# Patient Record
Sex: Female | Born: 1960 | Race: White | Hispanic: No | Marital: Single | Smoking: Former smoker
Health system: Southern US, Community
[De-identification: ages and names within clinical notes are randomized; demographics above are authoritative.]

## PROBLEM LIST (undated history)

## (undated) DIAGNOSIS — E079 Disorder of thyroid, unspecified: Secondary | ICD-10-CM

## (undated) HISTORY — DX: Disorder of thyroid, unspecified: E07.9

---

## 1998-05-29 ENCOUNTER — Other Ambulatory Visit: Admission: RE | Admit: 1998-05-29 | Discharge: 1998-05-29 | Payer: Self-pay | Admitting: Gynecology

## 1998-09-28 ENCOUNTER — Other Ambulatory Visit: Admission: RE | Admit: 1998-09-28 | Discharge: 1998-09-28 | Payer: Self-pay | Admitting: Gynecology

## 1999-03-19 ENCOUNTER — Other Ambulatory Visit: Admission: RE | Admit: 1999-03-19 | Discharge: 1999-03-19 | Payer: Self-pay | Admitting: Gynecology

## 1999-05-28 ENCOUNTER — Encounter: Payer: Self-pay | Admitting: Gynecology

## 1999-05-28 ENCOUNTER — Ambulatory Visit (HOSPITAL_COMMUNITY): Admission: RE | Admit: 1999-05-28 | Discharge: 1999-05-28 | Payer: Self-pay | Admitting: Gynecology

## 1999-08-13 ENCOUNTER — Other Ambulatory Visit: Admission: RE | Admit: 1999-08-13 | Discharge: 1999-08-13 | Payer: Self-pay | Admitting: Gynecology

## 2000-07-07 ENCOUNTER — Other Ambulatory Visit: Admission: RE | Admit: 2000-07-07 | Discharge: 2000-07-07 | Payer: Self-pay | Admitting: Gynecology

## 2000-09-01 ENCOUNTER — Encounter: Admission: RE | Admit: 2000-09-01 | Discharge: 2000-09-01 | Payer: Self-pay | Admitting: Family Medicine

## 2000-09-01 ENCOUNTER — Encounter: Payer: Self-pay | Admitting: Family Medicine

## 2001-07-05 ENCOUNTER — Other Ambulatory Visit: Admission: RE | Admit: 2001-07-05 | Discharge: 2001-07-05 | Payer: Self-pay | Admitting: Gynecology

## 2002-08-09 ENCOUNTER — Other Ambulatory Visit: Admission: RE | Admit: 2002-08-09 | Discharge: 2002-08-09 | Payer: Self-pay | Admitting: Gynecology

## 2002-09-06 ENCOUNTER — Encounter: Payer: Self-pay | Admitting: Gynecology

## 2002-09-06 ENCOUNTER — Ambulatory Visit (HOSPITAL_COMMUNITY): Admission: RE | Admit: 2002-09-06 | Discharge: 2002-09-06 | Payer: Self-pay | Admitting: Gynecology

## 2003-01-22 HISTORY — PX: ABDOMINAL HYSTERECTOMY: SHX81

## 2003-02-15 ENCOUNTER — Encounter (INDEPENDENT_AMBULATORY_CARE_PROVIDER_SITE_OTHER): Payer: Self-pay | Admitting: *Deleted

## 2003-02-15 ENCOUNTER — Inpatient Hospital Stay (HOSPITAL_COMMUNITY): Admission: RE | Admit: 2003-02-15 | Discharge: 2003-02-16 | Payer: Self-pay | Admitting: Gynecology

## 2003-08-09 ENCOUNTER — Other Ambulatory Visit: Admission: RE | Admit: 2003-08-09 | Discharge: 2003-08-09 | Payer: Self-pay | Admitting: Gynecology

## 2003-09-13 ENCOUNTER — Ambulatory Visit (HOSPITAL_COMMUNITY): Admission: RE | Admit: 2003-09-13 | Discharge: 2003-09-13 | Payer: Self-pay | Admitting: Gynecology

## 2003-09-13 ENCOUNTER — Encounter: Payer: Self-pay | Admitting: Gynecology

## 2004-09-25 ENCOUNTER — Other Ambulatory Visit: Admission: RE | Admit: 2004-09-25 | Discharge: 2004-09-25 | Payer: Self-pay | Admitting: Gynecology

## 2004-10-23 ENCOUNTER — Ambulatory Visit (HOSPITAL_COMMUNITY): Admission: RE | Admit: 2004-10-23 | Discharge: 2004-10-23 | Payer: Self-pay | Admitting: Gynecology

## 2005-11-26 ENCOUNTER — Other Ambulatory Visit: Admission: RE | Admit: 2005-11-26 | Discharge: 2005-11-26 | Payer: Self-pay | Admitting: Gynecology

## 2005-12-03 ENCOUNTER — Ambulatory Visit (HOSPITAL_COMMUNITY): Admission: RE | Admit: 2005-12-03 | Discharge: 2005-12-03 | Payer: Self-pay | Admitting: Gynecology

## 2005-12-22 HISTORY — PX: THYROIDECTOMY: SHX17

## 2006-12-02 ENCOUNTER — Other Ambulatory Visit: Admission: RE | Admit: 2006-12-02 | Discharge: 2006-12-02 | Payer: Self-pay | Admitting: Gynecology

## 2006-12-17 ENCOUNTER — Ambulatory Visit (HOSPITAL_COMMUNITY): Admission: RE | Admit: 2006-12-17 | Discharge: 2006-12-17 | Payer: Self-pay | Admitting: Gynecology

## 2007-12-09 ENCOUNTER — Other Ambulatory Visit: Admission: RE | Admit: 2007-12-09 | Discharge: 2007-12-09 | Payer: Self-pay | Admitting: Gynecology

## 2007-12-09 ENCOUNTER — Ambulatory Visit (HOSPITAL_COMMUNITY): Admission: RE | Admit: 2007-12-09 | Discharge: 2007-12-09 | Payer: Self-pay | Admitting: Gynecology

## 2011-05-09 NOTE — H&P (Signed)
NAME:  Alice Fitzpatrick, Alice Fitzpatrick                       ACCOUNT NO.:  [id number]   MEDICAL RECORD NO.:  [id number]                   PATIENT TYPE:  INP   LOCATION:  0005                                 FACILITY:  [facility]   PHYSICIAN:  Howard C. Mezer, M.D.               DATE OF BIRTH:  [birth year]   DATE OF ADMISSION:  02/15/2003  DATE OF DISCHARGE:                                HISTORY & PHYSICAL   ADMISSION DIAGNOSIS:  Fibroid uterus.   HISTORY OF PRESENT ILLNESS:  The patient is a 50 year old gravida 1, para 1,  female, admitted with last menstrual period on February 02, 2003, with a  fibroid uterus, increasing dysmenorrhea and dyspareunia, menorrhagia, and  bloating.  The patient has considered alternative therapies and has elected  to proceed with hysterectomy to treat the above symptoms.  On ultrasound  examination, the uterus contains two fibroids; one 51.4 x 35.4 mm and one  22.1 x 15.0 mm.  The ovaries appear to be normal.  Hysterectomy including  and excluding removal of the cervix and including and excluding removal of  the tubes and the ovaries has been discussed with the patient in detail.  Potential complications including but not limited to anesthesia, injury to  the bowel, bladder, ureters, possible fistula formation, possible blood loss  and transfusion and its sequelae, and possible infection in the wound or in  the pelvis have been discussed with the patient in detail.  The pros and  cons of removing the cervix and ovaries have been discussed, and the patient  will give her final decision prior to surgery.  The patient has reviewed the  ACOG booklet.  The postoperative expectations and restrictions have been  reviewed in detail.  Pain control has been discussed.  Permanent  sterilization has been emphasized.  The patient appears to have adequately  considered the alternative therapies and the potential complications of the  surgery and appears to understand her decision to  proceed.   PAST SURGICAL HISTORY:  1. Hysteroscopy.  2. D&C.  3. Cesarean section.  4. T&A.   PAST MEDICAL HISTORY:  Mild thrombocytopenia that has been evaluated by Dr.  Cephas Darby with a negative evaluation.   MEDICATIONS:  None.   ALLERGIES:  None known.   TOBACCO/ALCOHOL:  Smokes:  None.  ETOH:  Social.   FAMILY HISTORY:  Positive for carcinoma of the lung and breast.   SOCIAL HISTORY:  The patient is a [employer] and lives with her  significant other.   PHYSICAL EXAMINATION:  HEENT:  Negative.  LUNGS:  Clear.  HEART:  Without murmurs.  BREASTS:  Without masses although are cystic.  ABDOMEN:  Soft and nontender.  PELVIC:  Reveals the BUS, vagina, and cervix to be normal.  The uterus has  an anterior fibroid.  The adnexa are without palpable masses.  RECTAL:  Negative.  EXTREMITIES:  Negative.   IMPRESSION:  1. Fibroid  uterus.  2. Dysmenorrhea.  3. Dyspareunia.  4. Menorrhagia.  5. Idiopathic thrombocytopenic purpura.   PLAN:  Hysterectomy with or without removal of the cervix and with or  without removal of the ovaries, to be determined by the patient.                                               Leatha Gilding. Mezer, M.D.    HCM/MEDQ  D:  02/15/2003  T:  02/15/2003  Job:  [id number]   cc:   Genene Churn. Cyndie Chime, M.D.  [address] [facility]  [place]  [place] [address]  Fax: [phone]   Lianne Bushy, M.D.  [address]  [place]  [place] [address]  Fax: [phone]

## 2011-05-09 NOTE — Op Note (Signed)
NAME:  GEANNA, DIVIRGILIO                       ACCOUNT NO.:  [id number]   MEDICAL RECORD NO.:  [id number]                   PATIENT TYPE:  INP   LOCATION:  0005                                 FACILITY:  [facility]   PHYSICIAN:  Howard C. Mezer, M.D.               DATE OF BIRTH:  1961-03-17   DATE OF PROCEDURE:  02/15/2003  DATE OF DISCHARGE:                                 OPERATIVE REPORT   PREOPERATIVE DIAGNOSES:  1. Fibroid uterus.  2. Dysmenorrhea.  3. Dyspareunia.  4. Menorrhagia.   POSTOPERATIVE DIAGNOSES:  1. Fibroid uterus.  2. Dysmenorrhea.  3. Dyspareunia.  4. Menorrhagia.   PROCEDURE:  Supracervical hysterectomy.   SURGEON:  Leatha Gilding. Mezer, M.D.   ASSISTANT:  Leona Singleton, M.D.   ANESTHESIA:  General endotracheal.   PREPARATION:  Betadine.   DESCRIPTION OF PROCEDURE:  With the patient in the lithotomy position, she  was prepped and draped in the routine fashion.  An incision was made through  a previous Pfannenstiel incision.  The incision was carried down through the  subcutaneous tissue, and the fascia and peritoneum were opened without  difficulty.  A brief exploration of her upper abdomen was benign.  Exploration of the pelvis revealed the uterus to be approximately 10 weeks  in size with leiomyomata.  The right ovary was normal.  The left ovary had a  corpus luteum that appeared to be leaking.  The round ligaments were suture  ligated with #1 chromic and divided with cautery.  The anterior leaf of the  broad ligament was opened and the bladder taken down very easily.  The utero-  ovarian ligaments were clamped, cut, and free-tied with #1 chromic and then  suture ligated with #1 chromic.  The uterine arteries and veins were  clamped, cut, and suture ligated with #1 chromic and a single bite taken on  the cardinal ligament, clamped, cut, and suture ligated with #1 chromic.  With the bladder out of harm's way and the length of the cervix assessed,  the  uterus was amputated with cautery.  The cervical stump was cauterized  and the endocervical canal cauterized.  The cervical stump was then closed  with figure-of-eight #1 chromic sutures.  At this point Dr. Laureen Ochs, who had  complained of some indigestion and felt significantly unwell, left the OR.  This took approximately five minutes.  The surgical site had been covered,  and the pedicles were all carefully inspected at this point and hemostasis  was noted to be intact.  The pelvis was irrigated with copious amounts of  irrigating solution and again with hemostasis intact, the bladder flap was  placed over the cervical stump with a running 3-0 Vicryl suture.  The left  corpus luteum cyst was evacuated and the base cauterized.  Hemostasis  appeared to be intact.  The pedicles were reinspected, the ureters found to  be out of harm's way.  At the completion of the procedure, an effort was  made to place the large bowel in the cul-de-sac.  The omentum was brought  down, and the abdomen was closed in layers using a running 2-0 Vicryl on the  peritoneum, running 0 Vicryl to the midline bilaterally on the fascia, and  hemostasis throughout the incision, and the skin was closed with staples.  The estimated blood loss was approximately 125 mL.  The sponge, instrument,  and needle counts were correct x2.  The patient tolerated the procedure  well, was taken to the recovery room in satisfactory condition.                                               Leatha Gilding. Mezer, M.D.    HCM/MEDQ  D:  02/15/2003  T:  02/15/2003  Job:  [id number]   cc:   Leona Singleton, M.D.  [address]  [place]  [place] [address]  Fax: [phone]   Genene Churn. Cyndie Chime, M.D.  [address] [facility]  [place]  [place] [address]  Fax: [phone]   Lianne Bushy, M.D.  [address]  [place]  [place] [address]  Fax: [phone]

## 2012-04-05 ENCOUNTER — Other Ambulatory Visit (HOSPITAL_COMMUNITY): Payer: Self-pay | Admitting: Gynecology

## 2012-04-05 DIAGNOSIS — Z1231 Encounter for screening mammogram for malignant neoplasm of breast: Secondary | ICD-10-CM

## 2012-04-29 ENCOUNTER — Ambulatory Visit (HOSPITAL_COMMUNITY): Payer: Self-pay

## 2014-03-17 ENCOUNTER — Ambulatory Visit
Admission: RE | Admit: 2014-03-17 | Discharge: 2014-03-17 | Disposition: A | Discharge status: Home / Self Care | Payer: 59 | Source: Ambulatory Visit | Attending: General Surgery | Admitting: General Surgery

## 2014-03-17 ENCOUNTER — Other Ambulatory Visit: Payer: Self-pay | Admitting: General Surgery

## 2014-03-17 DIAGNOSIS — M545 Low back pain, unspecified: Secondary | ICD-10-CM

## 2014-05-09 ENCOUNTER — Other Ambulatory Visit: Payer: Self-pay | Admitting: Gynecology

## 2014-05-09 DIAGNOSIS — R928 Other abnormal and inconclusive findings on diagnostic imaging of breast: Secondary | ICD-10-CM

## 2014-05-18 ENCOUNTER — Ambulatory Visit
Admission: RE | Admit: 2014-05-18 | Discharge: 2014-05-18 | Disposition: A | Discharge status: Home / Self Care | Payer: 59 | Source: Ambulatory Visit | Attending: Gynecology | Admitting: Gynecology

## 2014-05-18 ENCOUNTER — Other Ambulatory Visit: Payer: Self-pay | Admitting: Gynecology

## 2014-05-18 DIAGNOSIS — R928 Other abnormal and inconclusive findings on diagnostic imaging of breast: Secondary | ICD-10-CM

## 2014-05-25 ENCOUNTER — Inpatient Hospital Stay: Admission: RE | Admit: 2014-05-25 | Payer: 59 | Source: Ambulatory Visit

## 2014-05-29 ENCOUNTER — Ambulatory Visit
Admission: RE | Admit: 2014-05-29 | Discharge: 2014-05-29 | Disposition: A | Discharge status: Home / Self Care | Payer: 59 | Source: Ambulatory Visit | Attending: Gynecology | Admitting: Gynecology

## 2014-05-29 DIAGNOSIS — R928 Other abnormal and inconclusive findings on diagnostic imaging of breast: Secondary | ICD-10-CM

## 2014-06-15 ENCOUNTER — Ambulatory Visit (INDEPENDENT_AMBULATORY_CARE_PROVIDER_SITE_OTHER): Payer: 59 | Admitting: General Surgery

## 2014-06-15 ENCOUNTER — Encounter (INDEPENDENT_AMBULATORY_CARE_PROVIDER_SITE_OTHER): Payer: Self-pay | Admitting: General Surgery

## 2014-06-15 VITALS — BP 134/82 | HR 60 | Temp 98.1°F | Resp 14 | Ht 65.5 in | Wt 151.2 lb

## 2014-06-15 DIAGNOSIS — N6089 Other benign mammary dysplasias of unspecified breast: Secondary | ICD-10-CM

## 2014-06-15 DIAGNOSIS — N6091 Unspecified benign mammary dysplasia of right breast: Secondary | ICD-10-CM | POA: Insufficient documentation

## 2014-06-15 NOTE — Patient Instructions (Signed)
Plan for right breast lumpectomy 

## 2014-06-16 NOTE — Progress Notes (Signed)
Patient ID: Alice Huberterri A Kleven, female   DOB: 11/19/1961, 53 y.o.   MRN: [id number]  Chief Complaint  Patient presents with  . Breast Problem    mnew pt- eval rt breast ADH    HPI Alice Fitzpatrick is a 53 y.o. female.  We are asked to see the patient in consultation by Dr. Anselmo Picklerandy Jackson to evaluate her for right breast atypical duct hyperplasia. The patient is a 53 year old white female who recently went for a routine screening mammogram. At that time she was found to have a 7 mm area of microcalcification in the upper in her right breast. 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 She does not do regular self exams. She denies any breast pain. She denies any discharge from her nipple. She does have a family history of a mother with breast cancer her who was diagnosed at the age of 55.  HPI  Past Medical History  Diagnosis Date  . Thyroid disease     hypothyroidism    Past Surgical History  Procedure Laterality Date  . Cesarean section  12/21/1984  . Abdominal hysterectomy  01/2003  . Thyroidectomy  2007    Family History  Problem Relation Age of Onset  . Cancer Mother     breast    Social History History  Substance Use Topics  . Smoking status: Former Smoker    Quit date: 12/23/1987  . Smokeless tobacco: Not on file  . Alcohol Use: 0.6 - 1.2 oz/week    1-2 Glasses of wine per week    No Known Allergies  Current Outpatient Prescriptions  Medication Sig Dispense Refill  . levothyroxine (SYNTHROID, LEVOTHROID) 75 MCG tablet Take 75 mcg by mouth daily before breakfast.      . MAGNESIUM ASPARTATE PO Take by mouth.      . Multiple Vitamin (MULTIVITAMIN WITH MINERALS) TABS tablet Take 1 tablet by mouth daily.       No current facility-administered medications for this visit.    Review of Systems Review of Systems  Constitutional: Negative.   HENT: Negative.   Eyes: Negative.   Respiratory: Negative.   Cardiovascular: Negative.   Gastrointestinal:  Negative.   Endocrine: Negative.   Genitourinary: Negative.   Musculoskeletal: Negative.   Skin: Negative.   Allergic/Immunologic: Negative.   Neurological: Negative.   Hematological: Negative.   Psychiatric/Behavioral: Negative.     Blood pressure 134/82, pulse 60, temperature 98.1 F (36.7 C), temperature source Temporal, resp. rate 14, height 5' 5.5" (1.664 m), weight 151 lb 3.2 oz (68.584 kg).  Physical Exam Physical Exam  Constitutional: She is oriented to person, place, and time. She appears well-developed and well-nourished.  HENT:  Head: Normocephalic and atraumatic.  Eyes: Conjunctivae and EOM are normal. Pupils are equal, round, and reactive to light.  Neck: Normal range of motion. Neck supple.  Cardiovascular: Normal rate, regular rhythm and normal heart sounds.   Pulmonary/Chest: Effort normal and breath sounds normal.  There is a small palpable bruise in the upper inner right breast. Otherwise there is no other palpable mass in either breast. There is no palpable axillary, supraclavicular, or cervical lymphadenopathy.  Abdominal: Soft. Bowel sounds are normal.  Musculoskeletal: Normal range of motion.  Lymphadenopathy:    She has no cervical adenopathy.  Neurological: She is alert and oriented to person, place, and time.  Skin: Skin is warm and dry.  Psychiatric: She has a normal mood and affect. Her behavior is normal.    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 Because this is a high-risk lesion and because it can have up in appearance very similar to DCIS I think it would be reasonable to remove this area. I have discussed with her in detail the risks and benefits of the operation to do this as well as some of the technical aspects and she understands. I would plan for a radioactive seed localized right breast lumpectomy.     Plan    At this point the patient  would like to think about it and talk to her family before deciding whether she wants to have surgery. If she decides not to have surgery then I would plan to see her back in 3 months and would probably follow her up with a 6 month mammogram.        TOTH III,PAUL S 06/16/2014, 9:07 AM

## 2015-05-22 ENCOUNTER — Other Ambulatory Visit: Payer: Self-pay | Admitting: Gynecology

## 2015-05-23 LAB — CYTOLOGY - PAP

## 2015-07-14 IMAGING — MG MM DIGITAL DIAGNOSTIC UNILAT*R*
3 series · 3 of 3 positions shown · non-contrast
Comparison: 05/03/2014 and earlier

CLINICAL DATA: The patient returns for evaluation of right breast
calcifications.

EXAM:
DIGITAL DIAGNOSTIC  RIGHT MAMMOGRAM WITH CAD

[R CC (1 of 2)]
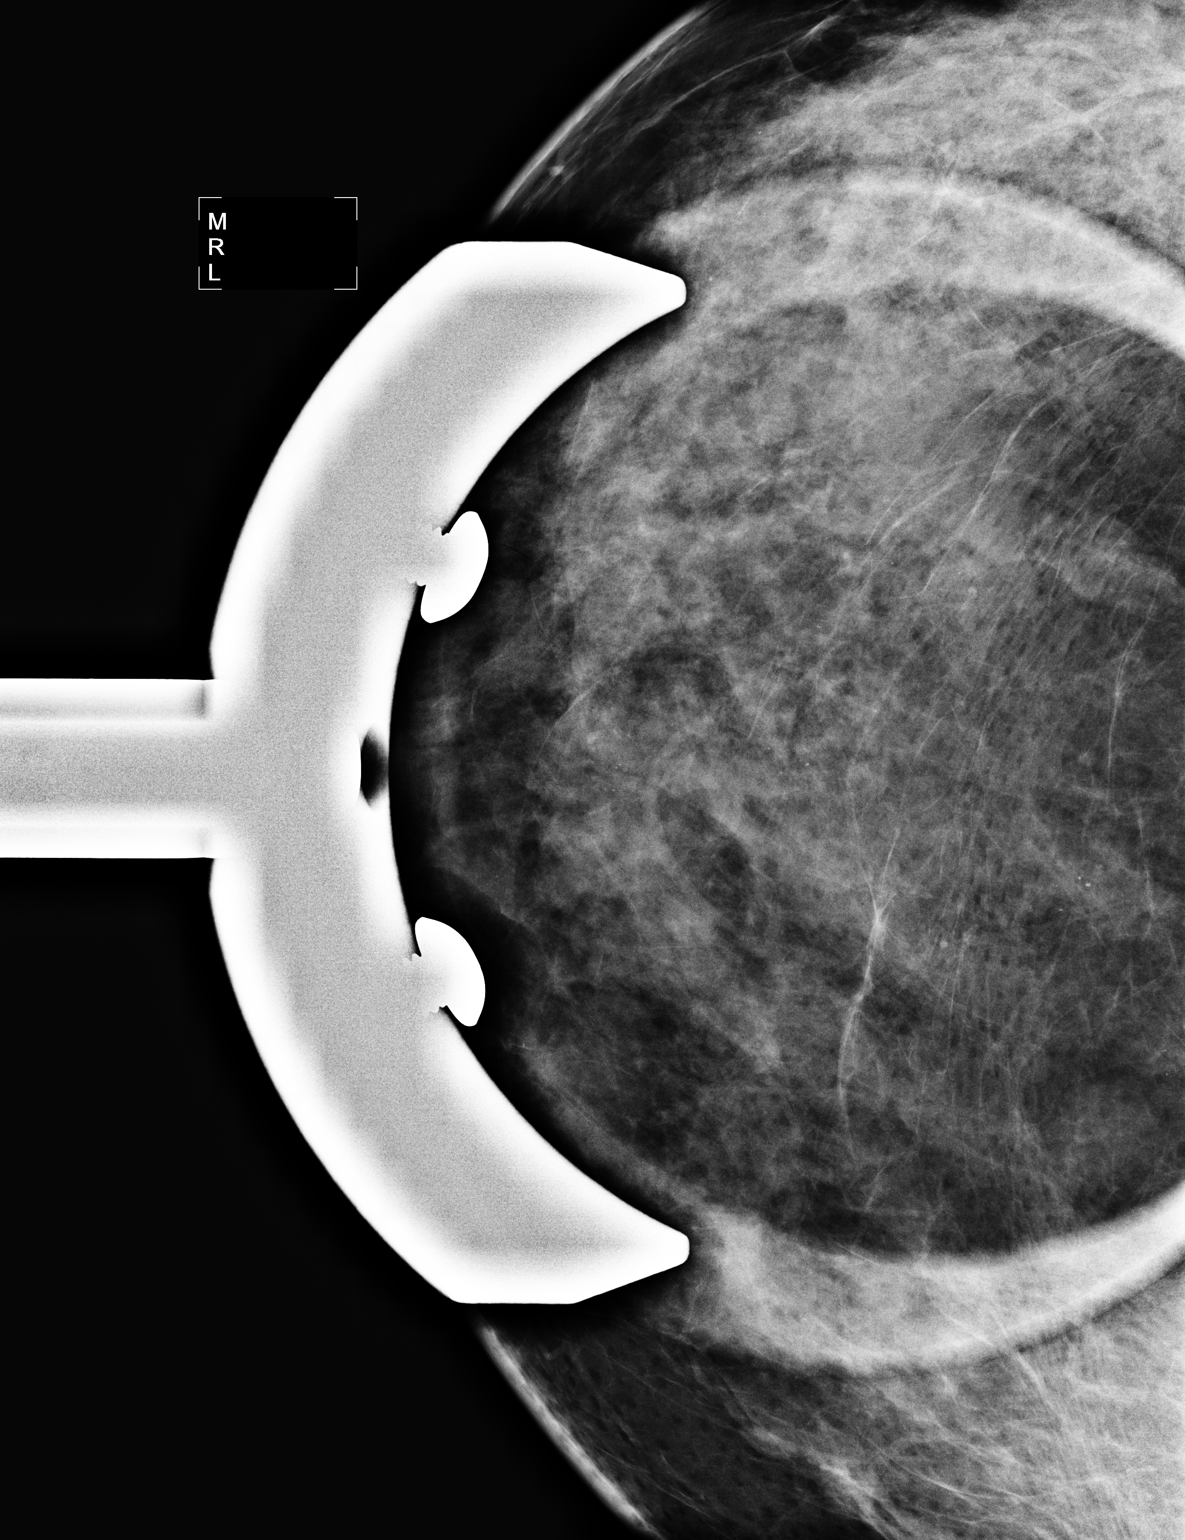

[R ML]
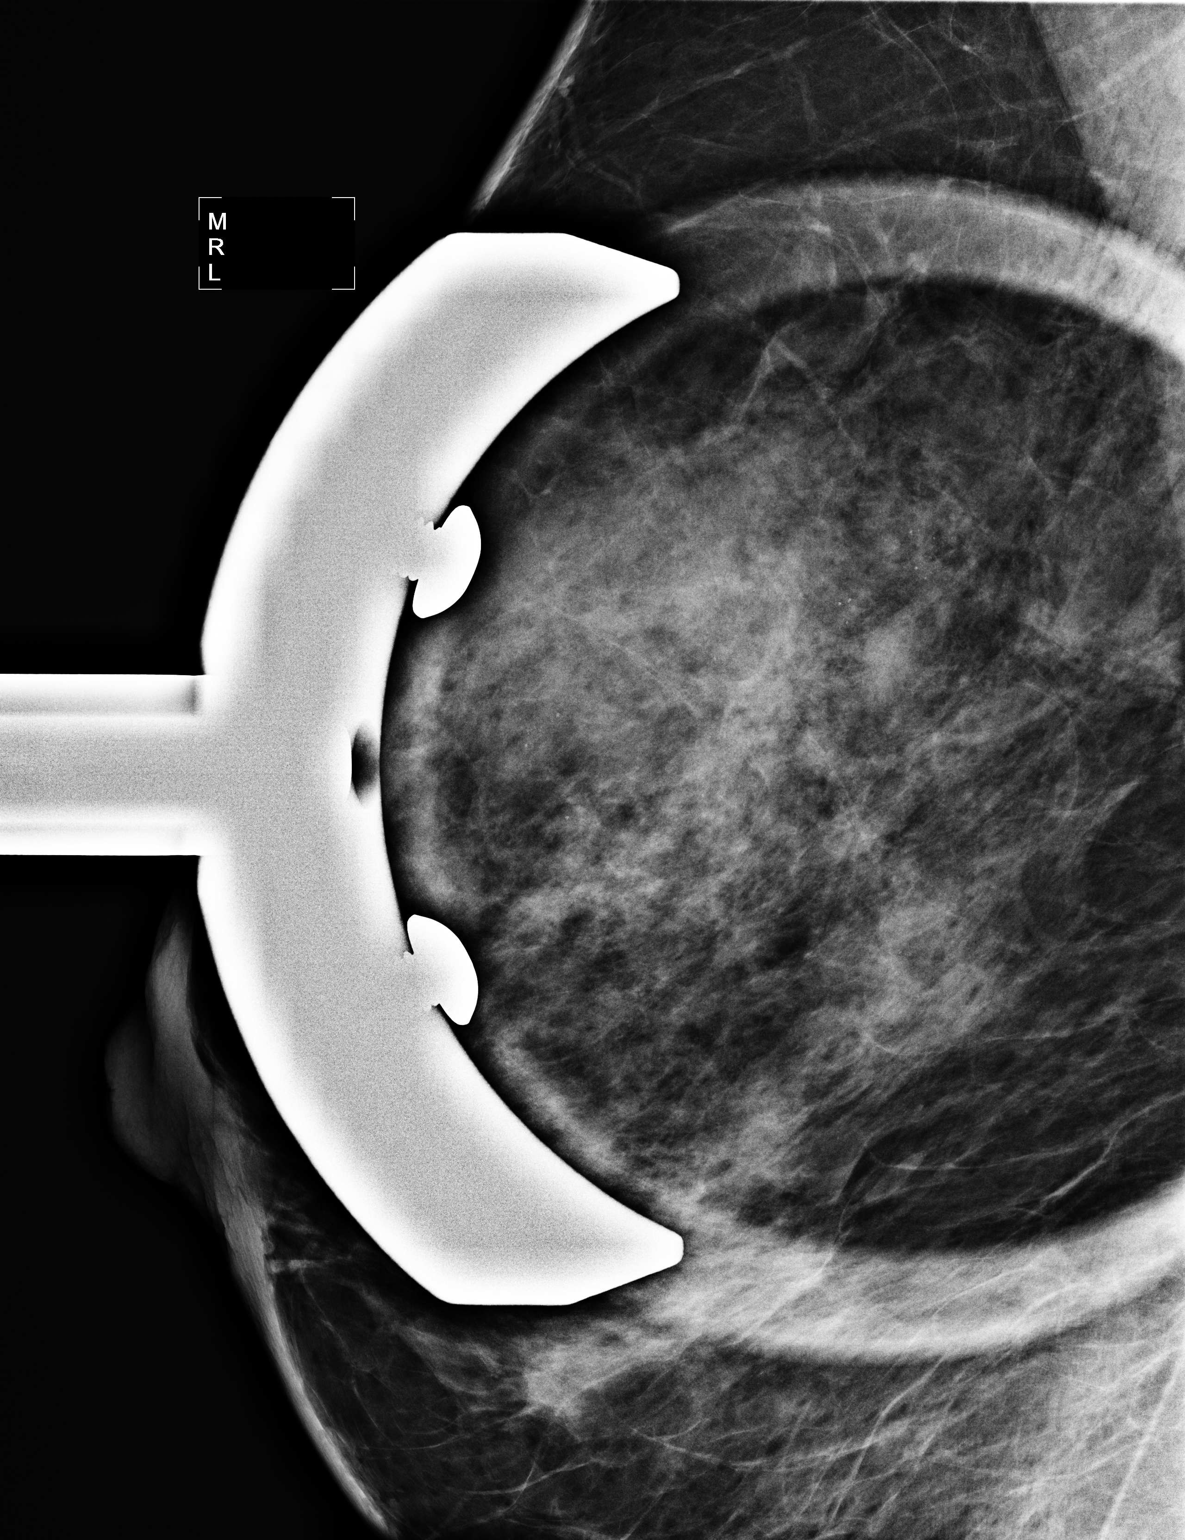

[R CC (2 of 2)]
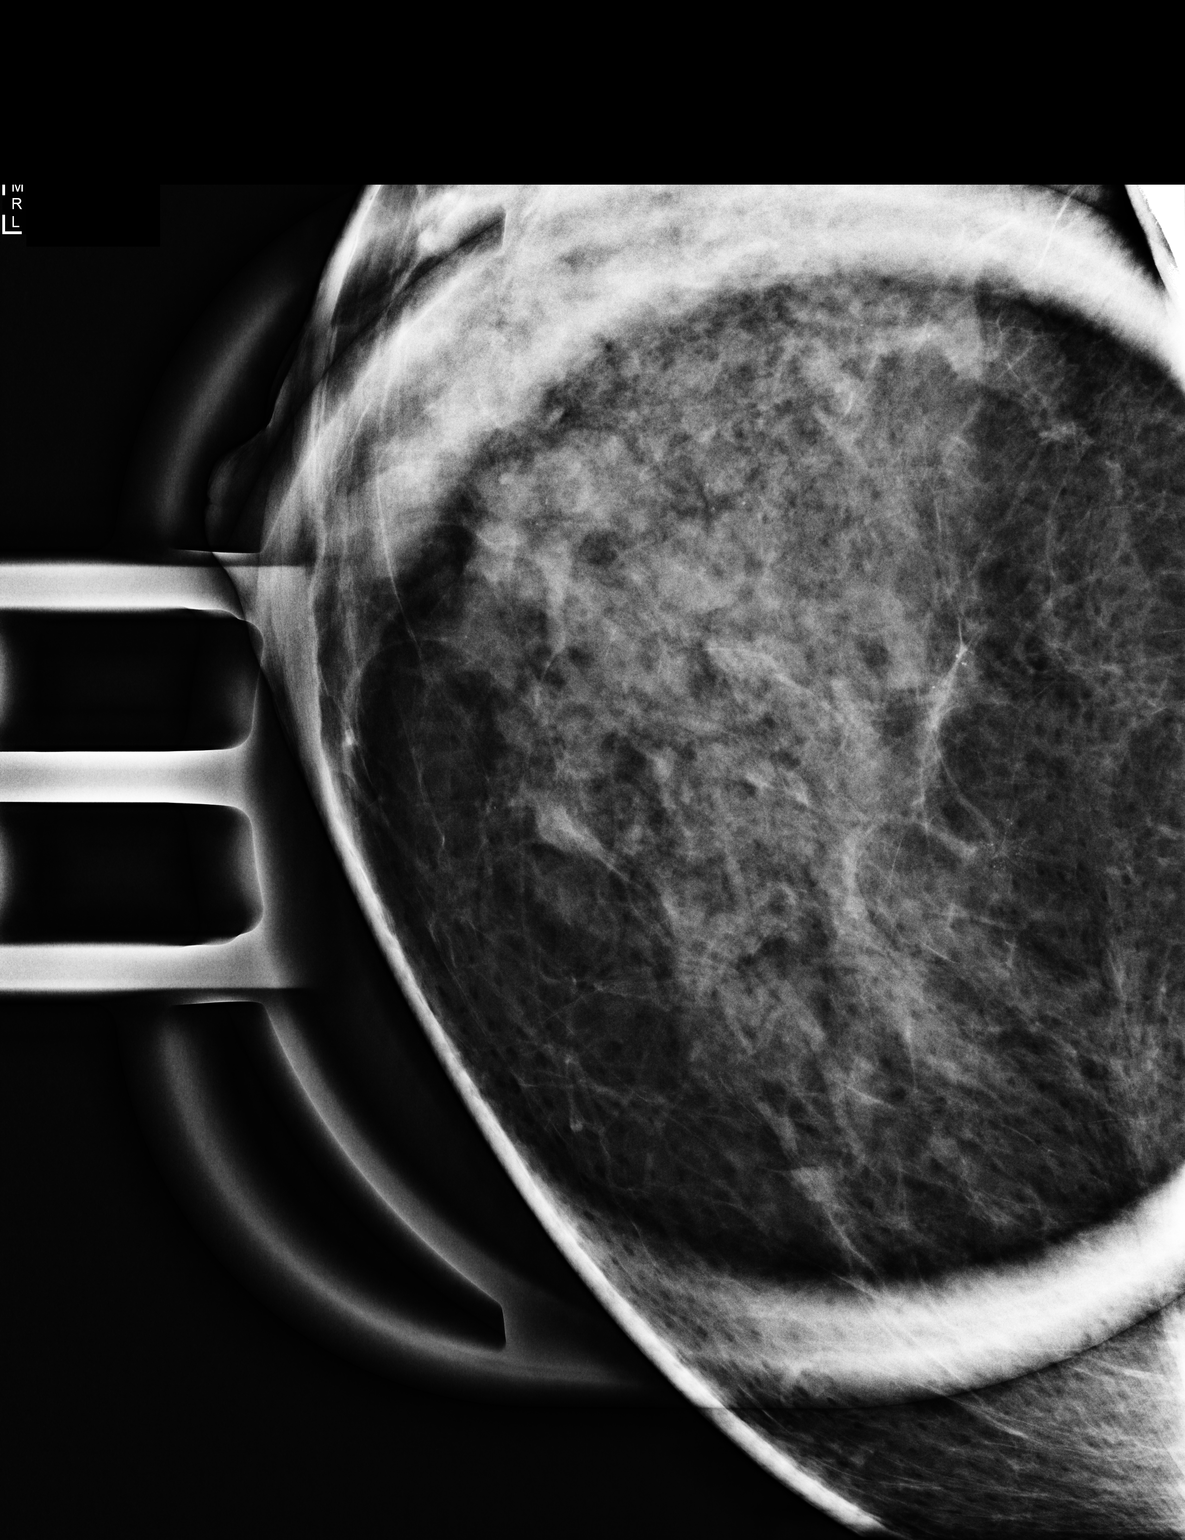

[3 of 3 positions shown; findings below may reference images not displayed]

ACR Breast Density Category c: The breast tissue is heterogeneously
dense, which may obscure small masses.
FINDINGS: Magnified views are performed of calcifications in the upper inner
quadrant of the right breast. A group of faint calcifications is 7
mm in diameter. Calcifications are primarily rounded but have
developed since prior studies. For this reason biopsy is recommended
to exclude malignancy.

Mammographic images were processed with CAD.
IMPRESSION: Indeterminate right breast calcifications for which tissue diagnosis
is recommended.

RECOMMENDATION:
Stereotactic guided core biopsy is recommended. Biopsy has been
scheduled for the patient on 05/25/2014.

I have discussed the findings and recommendations with the patient.
Results were also provided in writing at the conclusion of the
visit. If applicable, a reminder letter will be sent to the patient
regarding the next appointment.

BI-RADS CATEGORY  4: Suspicious.
